# Patient Record
Sex: Female | Born: 1990 | Race: White | Hispanic: Yes | Marital: Single | Smoking: Never smoker
Health system: Southern US, Community
[De-identification: ages and names within clinical notes are randomized; demographics above are authoritative.]

---

## 2012-08-27 ENCOUNTER — Emergency Department (HOSPITAL_COMMUNITY)
Admission: EM | Admit: 2012-08-27 | Discharge: 2012-08-27 | Disposition: A | Discharge status: Home / Self Care | Payer: BC Managed Care – PPO | Attending: Emergency Medicine | Admitting: Emergency Medicine

## 2012-08-27 ENCOUNTER — Encounter (HOSPITAL_COMMUNITY): Payer: Self-pay | Admitting: *Deleted

## 2012-08-27 DIAGNOSIS — O210 Mild hyperemesis gravidarum: Secondary | ICD-10-CM | POA: Insufficient documentation

## 2012-08-27 DIAGNOSIS — R42 Dizziness and giddiness: Secondary | ICD-10-CM | POA: Insufficient documentation

## 2012-08-27 DIAGNOSIS — O219 Vomiting of pregnancy, unspecified: Secondary | ICD-10-CM

## 2012-08-27 DIAGNOSIS — R350 Frequency of micturition: Secondary | ICD-10-CM | POA: Insufficient documentation

## 2012-08-27 DIAGNOSIS — O9989 Other specified diseases and conditions complicating pregnancy, childbirth and the puerperium: Secondary | ICD-10-CM | POA: Insufficient documentation

## 2012-08-27 LAB — URINALYSIS, ROUTINE W REFLEX MICROSCOPIC
Bilirubin Urine: NEGATIVE
Glucose, UA: NEGATIVE mg/dL
Hgb urine dipstick: NEGATIVE
Specific Gravity, Urine: 1.025 (ref 1.005–1.030)
Urobilinogen, UA: 0.2 mg/dL (ref 0.0–1.0)

## 2012-08-27 LAB — URINE MICROSCOPIC-ADD ON

## 2012-08-27 MED ORDER — ONDANSETRON 8 MG PO TBDP
8.0000 mg | ORAL_TABLET | Freq: Once | ORAL | Status: AC
  Administered 2012-08-27: 8 mg via ORAL
  Filled 2012-08-27: qty 1

## 2012-08-27 MED ORDER — PROMETHAZINE HCL 25 MG/ML IJ SOLN
12.5000 mg | Freq: Once | INTRAMUSCULAR | Status: AC
  Administered 2012-08-27: 12.5 mg via INTRAMUSCULAR
  Filled 2012-08-27: qty 1

## 2012-08-27 MED ORDER — PROMETHAZINE HCL 12.5 MG PO TABS: 12.5000 mg | ORAL_TABLET | Freq: Four times a day (QID) | ORAL | Status: AC | PRN

## 2012-08-27 NOTE — ED Notes (Signed)
Pt states she is 3 months pregnant and was told 3 weeks ago she had a bladder infection and has not been treated for it. Pt has no n/v and headaches. Pt has moved here from [place] and has had no prenatal visits basically.

## 2012-08-27 NOTE — ED Provider Notes (Signed)
History     CSN: [id number]  Arrival date & time 08/27/12  1755   None     Chief Complaint  Patient presents with  . Nausea    (Consider location/radiation/quality/duration/timing/severity/associated sxs/prior treatment) Patient is a 22 y.o. female presenting with vomiting. The history is provided by the patient.  Emesis Severity:  Mild Duration:  3 weeks Timing:  Sporadic Quality:  Undigested food Able to tolerate:  Liquids Progression:  Unchanged Chronicity:  New Recent urination:  Normal Relieved by:  Nothing Worsened by:  Food smell Ineffective treatments:  Ice chips and liquids Associated symptoms: no abdominal pain, no chills and no headaches   Risk factors: pregnant now    Renay Crammer is a 22 y.o. female @ [redacted]w[redacted]d who presents to the ED with nausea and vomiting. She recently moved to [place] and has not started prenatal care with anyone here yet.  History reviewed. No pertinent past medical history.  History reviewed. No pertinent past surgical history.  History reviewed. No pertinent family history.  History  Substance Use Topics  . Smoking status: Never Smoker   . Smokeless tobacco: Not on file  . Alcohol Use: No    OB History   Grav Para Term Preterm Abortions TAB SAB Ect Mult Living   1               Review of Systems  Constitutional: Negative for fever and chills.  HENT: Negative for neck pain.   Eyes: Negative for visual disturbance.  Respiratory: Negative for cough.   Cardiovascular: Negative for chest pain.  Gastrointestinal: Positive for nausea and vomiting. Negative for abdominal pain.  Genitourinary: Positive for frequency. Negative for dysuria and urgency.  Musculoskeletal: Negative for back pain.  Skin: Negative for rash.  Allergic/Immunologic: Negative for immunocompromised state.  Neurological: Positive for light-headedness. Negative for headaches.  Psychiatric/Behavioral: The patient is not nervous/anxious.     Allergies   Review of patient's allergies indicates no known allergies.  Home Medications  No current outpatient prescriptions on file.  BP 132/71  Pulse 93  Temp(Src) 97.8 F (36.6 C) (Oral)  Resp 16  Ht 5\' 4"  (1.626 m)  Wt 213 lb (96.616 kg)  BMI 36.54 kg/m2  SpO2 100%  LMP 06/08/2012  Physical Exam  Nursing note and vitals reviewed. Constitutional: She is oriented to person, place, and time. She appears well-developed and well-nourished. No distress.  HENT:  Head: Normocephalic and atraumatic.  Eyes: Conjunctivae and EOM are normal.  Neck: Normal range of motion. Neck supple.  Cardiovascular: Normal rate.   Pulmonary/Chest: Effort normal.  Abdominal: Soft. There is no tenderness.  FHT's 160  Musculoskeletal: Normal range of motion.  Neurological: She is alert and oriented to person, place, and time. No cranial nerve deficit.  Skin: Skin is warm and dry.  Psychiatric: She has a normal mood and affect. Her behavior is normal. Judgment and thought content normal.   Results for orders placed during the hospital encounter of 08/27/12 (from the past 24 hour(s))  URINALYSIS, ROUTINE W REFLEX MICROSCOPIC     Status: Abnormal   Collection Time    08/27/12  6:25 PM      Result Value Range   Color, Urine YELLOW  YELLOW   APPearance CLEAR  CLEAR   Specific Gravity, Urine 1.025  1.005 - 1.030   pH 6.5  5.0 - 8.0   Glucose, UA NEGATIVE  NEGATIVE mg/dL   Hgb urine dipstick NEGATIVE  NEGATIVE   Bilirubin Urine NEGATIVE  NEGATIVE   Ketones, ur NEGATIVE  NEGATIVE mg/dL   Protein, ur NEGATIVE  NEGATIVE mg/dL   Urobilinogen, UA 0.2  0.0 - 1.0 mg/dL   Nitrite NEGATIVE  NEGATIVE   Leukocytes, UA TRACE (*) NEGATIVE  URINE MICROSCOPIC-ADD ON     Status: Abnormal   Collection Time    08/27/12  6:25 PM      Result Value Range   Squamous Epithelial / LPF FEW (*) RARE   WBC, UA 11-20  <3 WBC/hpf   RBC / HPF 3-6  <3 RBC/hpf   Bacteria, UA FEW (*) RARE     ED Course  Procedures (including  critical care time)  Bed side ultrasound shows IUP consistent with dates of 11.[redacted] weeks gestation, cardiac activity identified. Doppler FHT's 160.   Patient given zofran 8 mg ODT and the nausea improved some, but still did not feel she could eat. Gave Phenergan 12.5 mg IM. Symptoms improved and she is taking PO fluids without difficulty.   MDM  22 y.o. female @ [redacted]w[redacted]d gestation with nausea and vomiting. Urine without ketones, trace of LE but patient has no UTI symptoms. Will send urine for culture. Will treat nausea and she will follow up with Dr. Emelda Fear to start prenatal care. Patient stable for discharge home to follow up for prenatal care.  Discussed with the patient clinical and lab findings and all questioned fully answered. She will return if any problems arise.    Medication List    TAKE these medications       promethazine 12.5 MG tablet  Commonly known as:  PHENERGAN  Take 1 tablet (12.5 mg total) by mouth every 6 (six) hours as needed for nausea.      ASK your doctor about these medications       prenatal multivitamin Tabs  Take 1 tablet by mouth at bedtime.               [facility] Orlene Och, NP 08/27/12 2003

## 2012-08-28 NOTE — ED Provider Notes (Signed)
Medical screening examination/treatment/procedure(s) were performed by non-physician practitioner and as supervising physician I was immediately available for consultation/collaboration. Merrilee Ancona, MD, FACEP   Elishua Radford L Sadiel Mota, MD 08/28/12 0028 

## 2012-08-29 LAB — URINE CULTURE: Colony Count: 15000

## 2014-01-28 ENCOUNTER — Encounter (HOSPITAL_COMMUNITY): Payer: Self-pay | Admitting: *Deleted

## 2016-09-10 ENCOUNTER — Emergency Department
Admission: EM | Admit: 2016-09-10 | Discharge: 2016-09-10 | Disposition: A | Discharge status: Home / Self Care | Payer: BLUE CROSS/BLUE SHIELD | Attending: Emergency Medicine | Admitting: Emergency Medicine

## 2016-09-10 ENCOUNTER — Encounter: Payer: Self-pay | Admitting: Emergency Medicine

## 2016-09-10 ENCOUNTER — Emergency Department: Payer: BLUE CROSS/BLUE SHIELD

## 2016-09-10 DIAGNOSIS — Y939 Activity, unspecified: Secondary | ICD-10-CM | POA: Diagnosis not present

## 2016-09-10 DIAGNOSIS — X509XXA Other and unspecified overexertion or strenuous movements or postures, initial encounter: Secondary | ICD-10-CM | POA: Diagnosis not present

## 2016-09-10 DIAGNOSIS — S39012A Strain of muscle, fascia and tendon of lower back, initial encounter: Secondary | ICD-10-CM | POA: Diagnosis not present

## 2016-09-10 DIAGNOSIS — Y999 Unspecified external cause status: Secondary | ICD-10-CM | POA: Diagnosis not present

## 2016-09-10 DIAGNOSIS — Y929 Unspecified place or not applicable: Secondary | ICD-10-CM | POA: Diagnosis not present

## 2016-09-10 DIAGNOSIS — S3992XA Unspecified injury of lower back, initial encounter: Secondary | ICD-10-CM | POA: Diagnosis present

## 2016-09-10 LAB — POCT PREGNANCY, URINE: Preg Test, Ur: NEGATIVE

## 2016-09-10 MED ORDER — IBUPROFEN 600 MG PO TABS: 600.0000 mg | ORAL_TABLET | Freq: Three times a day (TID) | ORAL | 0 refills | Status: AC | PRN

## 2016-09-10 MED ORDER — OXYCODONE-ACETAMINOPHEN 7.5-325 MG PO TABS: 1.0000 | ORAL_TABLET | Freq: Four times a day (QID) | ORAL | 0 refills | Status: AC | PRN

## 2016-09-10 MED ORDER — CYCLOBENZAPRINE HCL 10 MG PO TABS: 10.0000 mg | ORAL_TABLET | Freq: Three times a day (TID) | ORAL | 0 refills | Status: AC | PRN

## 2016-09-10 MED ORDER — ORPHENADRINE CITRATE 30 MG/ML IJ SOLN
60.0000 mg | Freq: Two times a day (BID) | INTRAMUSCULAR | Status: DC
  Administered 2016-09-10: 60 mg via INTRAMUSCULAR
  Filled 2016-09-10: qty 2

## 2016-09-10 MED ORDER — HYDROMORPHONE HCL 1 MG/ML IJ SOLN
1.0000 mg | Freq: Once | INTRAMUSCULAR | Status: AC
  Administered 2016-09-10: 1 mg via INTRAMUSCULAR
  Filled 2016-09-10: qty 1

## 2016-09-10 MED ORDER — KETOROLAC TROMETHAMINE 60 MG/2ML IM SOLN
30.0000 mg | Freq: Once | INTRAMUSCULAR | Status: AC
  Administered 2016-09-10: 30 mg via INTRAMUSCULAR
  Filled 2016-09-10: qty 2

## 2016-09-10 NOTE — ED Notes (Signed)

## 2016-09-10 NOTE — ED Notes (Signed)
Patient made aware of need of urine sample. States she is unable to void at this time. Given a cup of water. Patient attempted to get out of bed. Patient states, "I can't get up. There is no way". Ron, PA notified and aware. Will give pain medication more time to kick in and will attempt again.

## 2016-09-10 NOTE — ED Notes (Signed)
Patient ambulated to commode with one staff assist.

## 2016-09-10 NOTE — ED Provider Notes (Signed)
[facility] Emergency Department Provider Note   ____________________________________________   First MD Initiated Contact with Patient 09/10/16 [phone]     (approximate)  I have reviewed the triage vital signs and the nursing notes.   HISTORY  Chief Complaint Back Pain    HPI Carolyn Alvarado is a 26 y.o. female patient arrived from home via EMS complaining of acute low back pain. Patient states she is been no blood noted pains and she felt her back " locked up". Patient denies any radicular component to her back pain. Patient denies any bladder or bowel dysfunction. Patient said this happened approximately 2 years ago but she did not seek medical care at that time. Patient rates the pain as a 9/10. Patient described a pain as "sharp". No palliative measures prior to arrival.   History reviewed. No pertinent past medical history.  There are no active problems to display for this patient.   History reviewed. No pertinent surgical history.  Prior to Admission medications   Medication Sig Start Date End Date Taking? Authorizing Provider  cyclobenzaprine (FLEXERIL) 10 MG tablet Take 1 tablet (10 mg total) by mouth 3 (three) times daily as needed. 09/10/16   Joni Reining, PA-C  ibuprofen (ADVIL,MOTRIN) 600 MG tablet Take 1 tablet (600 mg total) by mouth every 8 (eight) hours as needed. 09/10/16   Joni Reining, PA-C  oxyCODONE-acetaminophen (PERCOCET) 7.5-325 MG tablet Take 1 tablet by mouth every 6 (six) hours as needed for severe pain. 09/10/16   Joni Reining, PA-C  Prenatal Vit-Fe Fumarate-FA (PRENATAL MULTIVITAMIN) TABS Take 1 tablet by mouth at bedtime.    [provider]  promethazine (PHENERGAN) 12.5 MG tablet Take 1 tablet (12.5 mg total) by mouth every 6 (six) hours as needed for nausea. 08/27/12   Janne Napoleon, NP    Allergies Patient has no known allergies.  No family history on file.  Social History Social History  Substance Use  Topics  . Smoking status: Never Smoker  . Smokeless tobacco: Not on file  . Alcohol use No    Review of Systems  Constitutional: No fever/chills Eyes: No visual changes. ENT: No sore throat. Cardiovascular: Denies chest pain. Respiratory: Denies shortness of breath. Gastrointestinal: No abdominal pain.  No nausea, no vomiting.  No diarrhea.  No constipation. Genitourinary: Negative for dysuria. Musculoskeletal: Positive for back pain. Skin: Negative for rash. Neurological: Negative for headaches, focal weakness or numbness.   ____________________________________________   PHYSICAL EXAM:  VITAL SIGNS: ED Triage Vitals [09/10/16 0857]  Enc Vitals Group     BP 111/63     Pulse Rate 82     Resp 20     Temp 98.2 F (36.8 C)     Temp Source Oral     SpO2 100 %     Weight 240 lb (108.9 kg)     Height 5\' 4"  (1.626 m)     Head Circumference      Peak Flow      Pain Score 9     Pain Loc      Pain Edu?      Excl. in GC?     Constitutional: Alert and oriented.Moderate distress Eyes: Conjunctivae are normal. PERRL. EOMI. Neck: No stridor.  No cervical spine tenderness to palpation. Cardiovascular: Normal rate, regular rhythm. Grossly normal heart sounds.  Good peripheral circulation. Respiratory: Normal respiratory effort.  No retractions. Lungs CTAB. Musculoskeletal: No obvious spinal deformity. Patient has moderate guarding palpation L2-L5 4.  Patient decreased range of motion's all fields. Patient has bilateral paraspinal muscle spasms. Patient has negative straight leg test. No lower extremity tenderness nor edema.  No joint effusions. Neurologic:  Normal speech and language. No gross focal neurologic deficits are appreciated. No gait instability. Skin:  Skin is warm, dry and intact. No rash noted. Psychiatric: Mood and affect are normal. Speech and behavior are normal.  ____________________________________________   LABS (all labs ordered are listed, but only abnormal  results are displayed)  Labs Reviewed  POCT PREGNANCY, URINE  POC URINE PREG, ED   ____________________________________________  EKG   ____________________________________________  RADIOLOGY  Dg Lumbar Spine Complete  Result Date: 09/10/2016 CLINICAL DATA:  Lumbago EXAM: LUMBAR SPINE - COMPLETE 4+ VIEW COMPARISON:  None. FINDINGS: Frontal, lateral, spot lumbosacral lateral, and bilateral oblique views were obtained. There are 5 non-rib-bearing lumbar type vertebral bodies. There is no fracture or spondylolisthesis. Disc spaces appear unremarkable. There is no appreciable facet arthropathy. IMPRESSION: No fracture or spondylolisthesis.  No appreciable arthropathy. Electronically Signed   By: Bretta BangWilliam  Woodruff III M.D.   On: 09/10/2016 11:07    ____________________________________________   PROCEDURES  Procedure(s) performed: None  Procedures  Critical Care performed: No  ____________________________________________   INITIAL IMPRESSION / ASSESSMENT AND PLAN / ED COURSE  Pertinent labs & imaging results that were available during my care of the patient were reviewed by me and considered in my medical decision making (see chart for details).  Acute lumbar sacral strain. Discussed x-ray finding with patient. Patient given discharge care instructions no work no. Patient advised follow-up family doctor clinic if condition persists.      ____________________________________________   FINAL CLINICAL IMPRESSION(S) / ED DIAGNOSES  Final diagnoses:  Strain of lumbar region, initial encounter      NEW MEDICATIONS STARTED DURING THIS VISIT:  New Prescriptions   CYCLOBENZAPRINE (FLEXERIL) 10 MG TABLET    Take 1 tablet (10 mg total) by mouth 3 (three) times daily as needed.   IBUPROFEN (ADVIL,MOTRIN) 600 MG TABLET    Take 1 tablet (600 mg total) by mouth every 8 (eight) hours as needed.   OXYCODONE-ACETAMINOPHEN (PERCOCET) 7.5-325 MG TABLET    Take 1 tablet by mouth every  6 (six) hours as needed for severe pain.     Note:  This document was prepared using Dragon voice recognition software and may include unintentional dictation errors.    Joni ReiningSmith, Jaquay Morneault K, PA-C 09/10/16 1120    Jene EveryKinner, Robert, MD 09/10/16 [phone]

## 2016-09-10 NOTE — ED Triage Notes (Signed)
Patient presents to ED via [facility] from home with c/o lower back pain. Patient was putting on pants today and she felt her back "lock up". Patient denies any other symptoms at this time. A&O x4. Moaning during assessment.

## 2019-02-01 IMAGING — CR DG LUMBAR SPINE COMPLETE 4+V
1 series · 5 of 5 positions shown · non-contrast
Comparison: None.

CLINICAL DATA: Lumbago

EXAM:
LUMBAR SPINE - COMPLETE 4+ VIEW

[Series 1: t lumbar spine ap · 0.14mm/px · 5 of 5 slices shown]
[im 1/5]
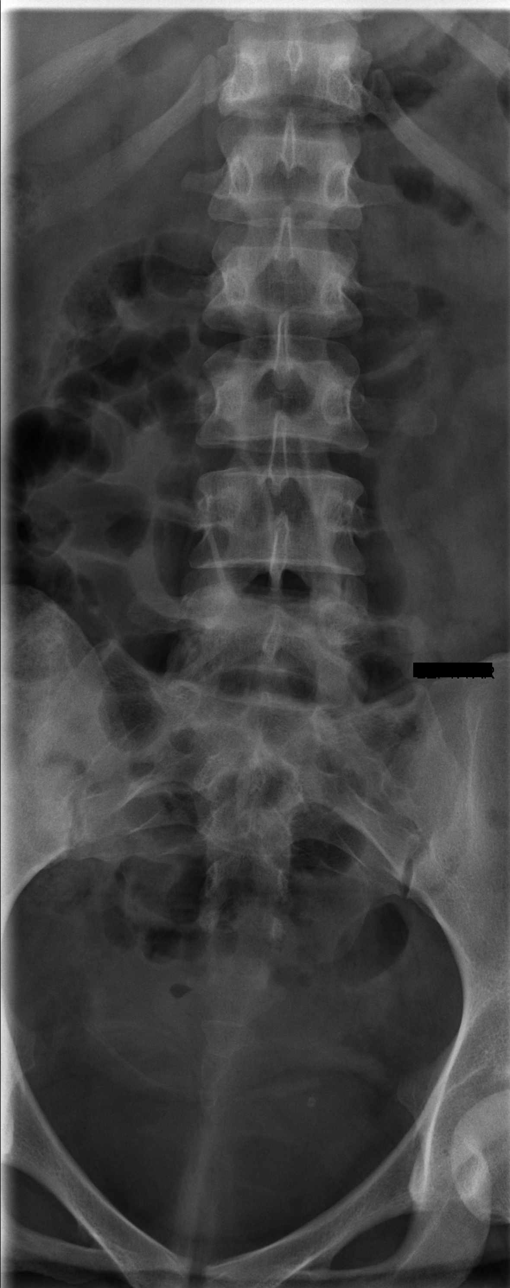
[im 2/5]
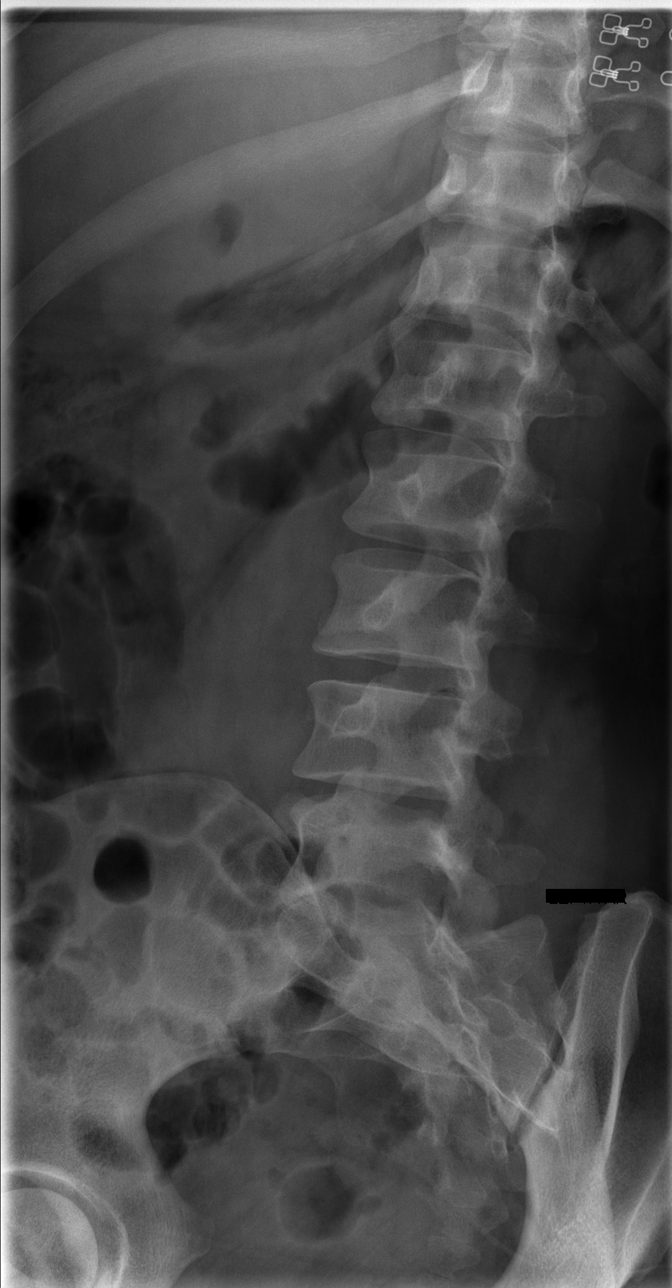
[im 3/5]
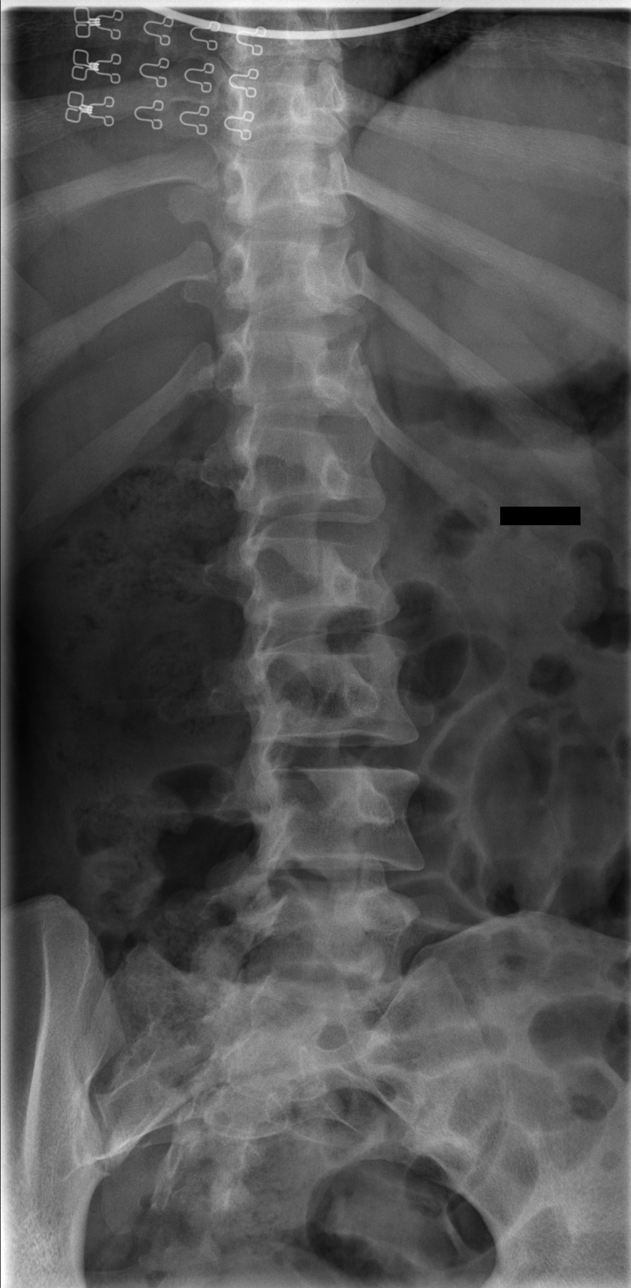
[im 4/5]
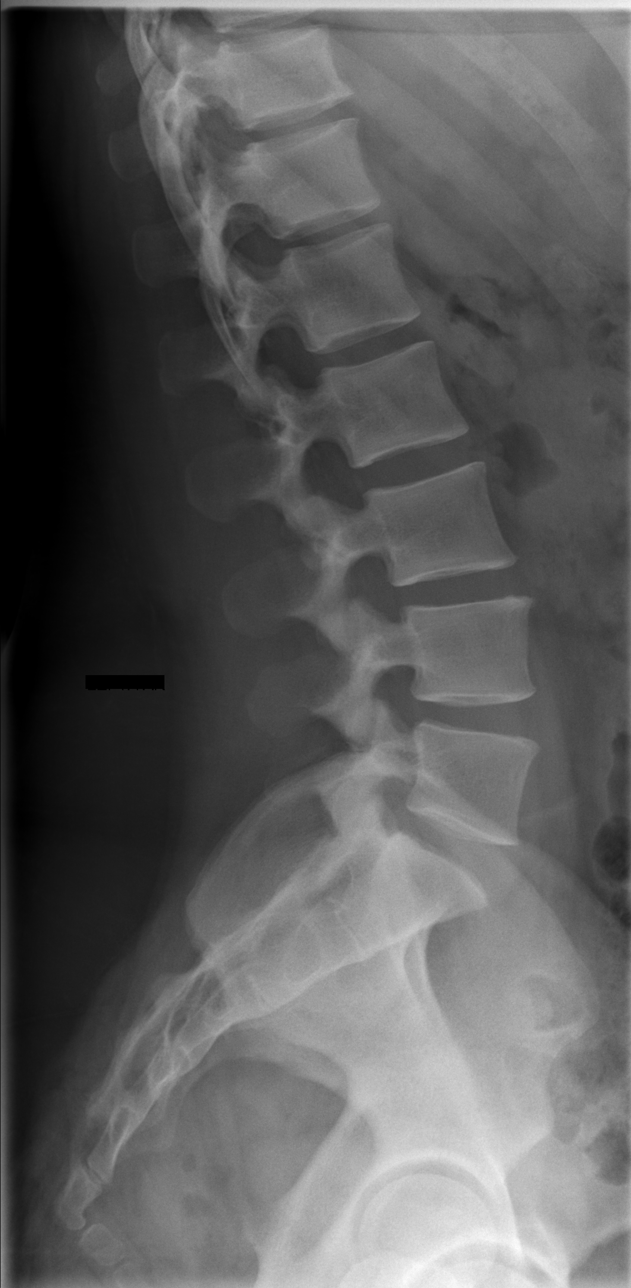
[im 5/5]
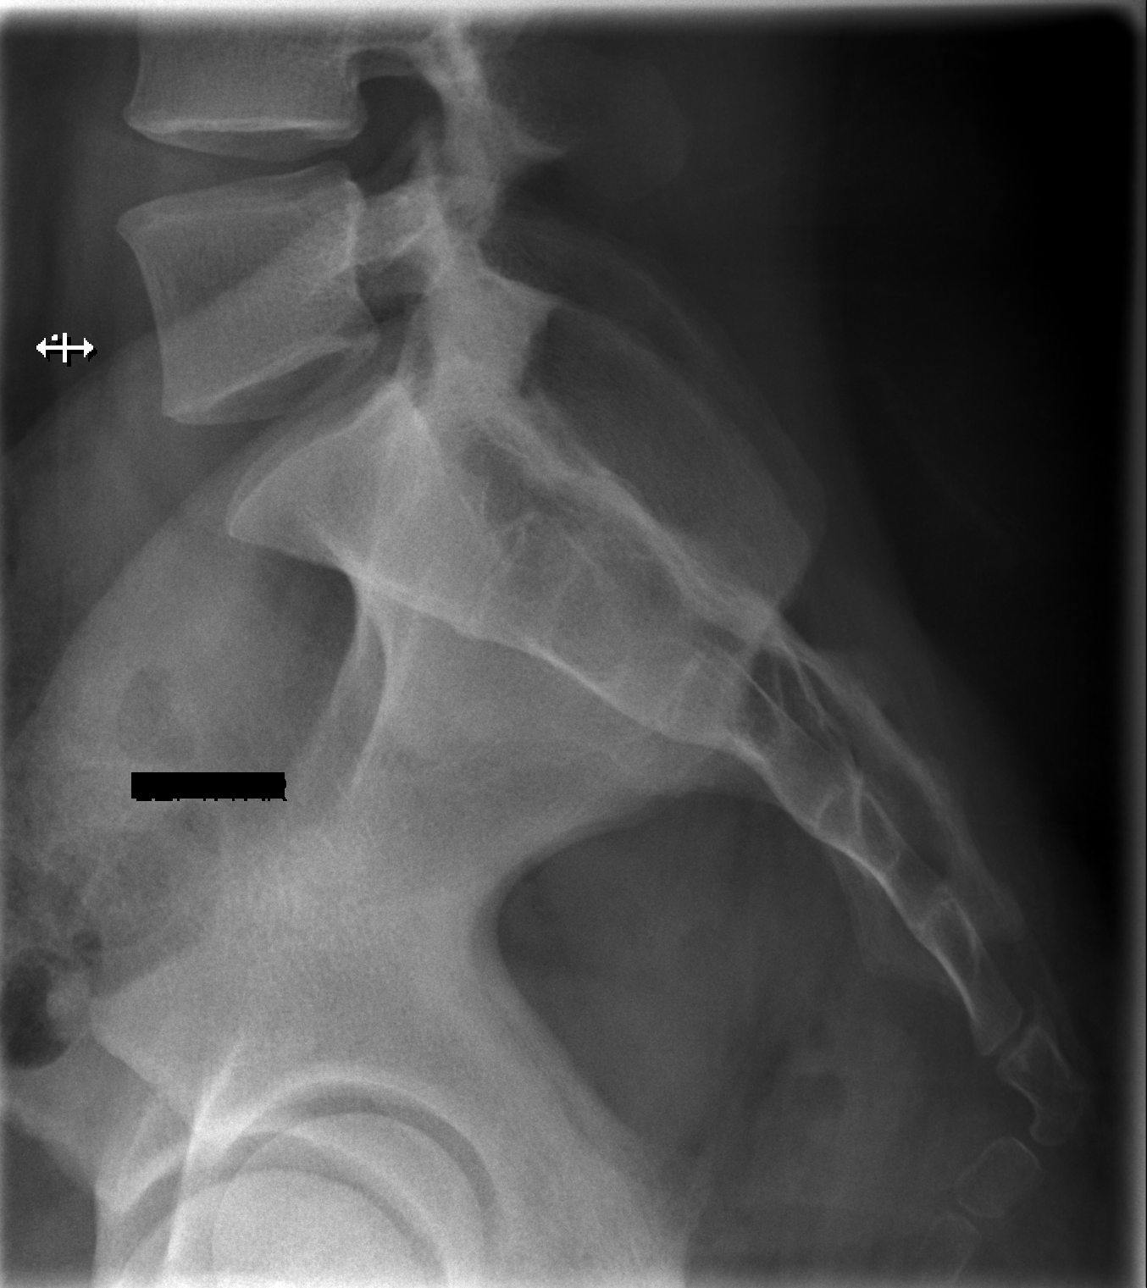

[5 of 5 positions shown; findings below may reference images not displayed]

FINDINGS: Frontal, lateral, spot lumbosacral lateral, and bilateral oblique
views were obtained. There are 5 non-rib-bearing lumbar type
vertebral bodies. There is no fracture or spondylolisthesis. Disc
spaces appear unremarkable. There is no appreciable facet
arthropathy.
IMPRESSION: No fracture or spondylolisthesis.  No appreciable arthropathy.
